# Patient Record
Sex: Female | Born: 2000 | Race: Black or African American | Hispanic: No | Marital: Single | Smoking: Current some day smoker
Health system: Southern US, Community
[De-identification: ages and names within clinical notes are randomized; demographics above are authoritative.]

## PROBLEM LIST (undated history)

## (undated) DIAGNOSIS — E559 Vitamin D deficiency, unspecified: Secondary | ICD-10-CM

## (undated) DIAGNOSIS — M1990 Unspecified osteoarthritis, unspecified site: Secondary | ICD-10-CM

---
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
## 2022-08-24 ENCOUNTER — Encounter (HOSPITAL_COMMUNITY): Payer: Self-pay

## 2022-08-24 ENCOUNTER — Other Ambulatory Visit: Payer: Self-pay

## 2022-08-24 ENCOUNTER — Emergency Department (HOSPITAL_COMMUNITY)
Admission: EM | Admit: 2022-08-24 | Discharge: 2022-08-24 | Disposition: A | Discharge status: Home / Self Care | Payer: Medicaid Other | Attending: Emergency Medicine | Admitting: Emergency Medicine

## 2022-08-24 DIAGNOSIS — R42 Dizziness and giddiness: Secondary | ICD-10-CM | POA: Diagnosis present

## 2022-08-24 HISTORY — DX: Vitamin D deficiency, unspecified: E55.9

## 2022-08-24 LAB — COMPREHENSIVE METABOLIC PANEL
ALT: 14 U/L (ref 0–44)
AST: 17 U/L (ref 15–41)
Albumin: 3.9 g/dL (ref 3.5–5.0)
Alkaline Phosphatase: 71 U/L (ref 38–126)
Anion gap: 9 (ref 5–15)
BUN: 9 mg/dL (ref 6–20)
CO2: 24 mmol/L (ref 22–32)
Calcium: 9.2 mg/dL (ref 8.9–10.3)
Chloride: 106 mmol/L (ref 98–111)
Creatinine, Ser: 0.4 mg/dL — ABNORMAL LOW (ref 0.44–1.00)
GFR, Estimated: >60 mL/min (ref >60)
Glucose, Bld: 108 mg/dL — ABNORMAL HIGH (ref 70–99)
Potassium: 3.7 mmol/L (ref 3.5–5.1)
Sodium: 139 mmol/L (ref 135–145)
Total Bilirubin: 0.4 mg/dL (ref 0.3–1.2)
Total Protein: 7.8 g/dL (ref 6.5–8.1)

## 2022-08-24 LAB — CBC WITH DIFFERENTIAL/PLATELET
Abs Immature Granulocytes: 0.01 10*3/uL (ref 0.00–0.07)
Basophils Absolute: 0 10*3/uL (ref 0.0–0.1)
Basophils Relative: 1 %
Eosinophils Absolute: 0 10*3/uL (ref 0.0–0.5)
Eosinophils Relative: 0 %
HCT: 37.2 % (ref 36.0–46.0)
Hemoglobin: 11.8 g/dL — ABNORMAL LOW (ref 12.0–15.0)
Immature Granulocytes: 0 %
Lymphocytes Relative: 45 %
Lymphs Abs: 1.7 10*3/uL (ref 0.7–4.0)
MCH: 27.6 pg (ref 26.0–34.0)
MCHC: 31.7 g/dL (ref 30.0–36.0)
MCV: 87.1 fL (ref 80.0–100.0)
Monocytes Absolute: 0.3 10*3/uL (ref 0.1–1.0)
Monocytes Relative: 8 %
Neutro Abs: 1.7 10*3/uL (ref 1.7–7.7)
Neutrophils Relative %: 46 %
Platelets: 296 10*3/uL (ref 150–400)
RBC: 4.27 MIL/uL (ref 3.87–5.11)
RDW: 13.9 % (ref 11.5–15.5)
WBC: 3.8 10*3/uL — ABNORMAL LOW (ref 4.0–10.5)
nRBC: 0 % (ref 0.0–0.2)

## 2022-08-24 MED ORDER — MECLIZINE HCL 25 MG PO TABS
25.0000 mg | ORAL_TABLET | Freq: Once | ORAL | Status: AC
  Administered 2022-08-24: 25 mg via ORAL
  Filled 2022-08-24: qty 1

## 2022-08-24 MED ORDER — MECLIZINE HCL 25 MG PO TABS: 25.0000 mg | ORAL_TABLET | Freq: Three times a day (TID) | ORAL | 0 refills | Status: AC | PRN

## 2022-08-24 NOTE — ED Provider Triage Note (Addendum)
Emergency Medicine Provider Triage Evaluation Note  April Palmer , a 21 y.o. female  was evaluated in triage.  Pt complains of dizziness.  Patient reports feelings of dizziness beginning yesterday upon awakening.  She states that she initially thought that symptoms were related to hallucination but they persisted.  She feels "room spinning type sensation" that is worsened with positional changes and changes of her head.  Denies history of similar symptoms.  States that she has had intermittent migraine over the past few days as well that feels similar in nature to migraines experienced in the past.  Denies visual disturbance, gait abnormalities (besides when feelings of dizziness), weakness/sensory deficits in upper lower extremities, slurred speech, drooping of the face.  Review of Systems  Positive: See above Negative:   Physical Exam  BP (!) 138/92 (BP Location: Left Arm)   Pulse 96   Temp 98.7 F (37.1 C) (Oral)   Resp 18   Ht 5\' 4"  (1.626 m)   Wt 88 kg   LMP 08/09/2022 (Approximate)   SpO2 99%   BMI 33.30 kg/m  Gen:   Awake, no distress   Resp:  Normal effort  MSK:   Moves extremities without difficulty Other:    Medical Decision Making  Medically screening exam initiated at 1:54 PM.  Appropriate orders placed.  Rise Traeger was informed that the remainder of the evaluation will be completed by another provider, this initial triage assessment does not replace that evaluation, and the importance of remaining in the ED until their evaluation is complete.  Urine pregnancy negative in urgent care today.    Imagene Sheller, Peter Garter 08/24/22 1355    14/08/23, Peter Garter 08/24/22 1356

## 2022-08-24 NOTE — Discharge Instructions (Addendum)
Note the workup today was overall reassuring.  As discussed, your symptoms of dizziness are most likely secondary to peripheral cause of vertigo.  Take meclizine as needed for similar symptoms.  Recommend follow-up with your primary care as well as neurology regarding your symptoms.  Please not hesitate to return to emergency department for worrisome signs and symptoms we discussed become apparent.

## 2022-08-24 NOTE — ED Triage Notes (Addendum)
Patient reports that she became dizzy yesterday. Patient states when I move it feels like I did it 10 times. "It is more like spinning." Patient states she can not dress herself due to dizziness.  Patient states she went to an UC today and was told to come to the ED for further evaluation. Patient states she was tested for flu, Covid and urine and all were negative.

## 2022-08-24 NOTE — ED Provider Notes (Signed)
[facility]-EMERGENCY DEPT Provider Note   CSN: [id number] Arrival date & time: 08/24/22  1318     History  Chief Complaint  Patient presents with   Dizziness    April Palmer is a 21 y.o. female.   Dizziness   21 year old female presents emergency department with complaints of dizziness. Patient reports feelings of dizziness beginning yesterday upon awakening. She states that she initially thought that symptoms were related to hallucination but they persisted. She feels "room spinning type sensation" that is worsened with positional changes and changes of her head. Denies history of similar symptoms. States that she has had intermittent migraine over the past few days as well that feels similar in nature to migraines experienced in the past. Denies visual disturbance, gait abnormalities (besides when feelings of dizziness), weakness/sensory deficits in upper lower extremities, slurred speech, drooping of the face.   No significant past medical history.  Home Medications Prior to Admission medications   Medication Sig Start Date End Date Taking? Authorizing Provider  meclizine (ANTIVERT) 25 MG tablet Take 1 tablet (25 mg total) by mouth 3 (three) times daily as needed for dizziness. 08/24/22  Yes Sherian Maroon A, PA  cetirizine (ZYRTEC) 10 MG tablet Take 1 tablet (10 mg total) by mouth daily. For 2 weeks then as needed for allergy symptoms 12/03/14   Ree Shay, MD  desmopressin (DDAVP) 0.1 MG tablet Take 0.5 tablets (0.05 mg total) by mouth as needed. 02/29/20   Dessa Phi, MD  metroNIDAZOLE (FLAGYL) 500 MG tablet Take 500 mg by mouth 2 (two) times daily. 02/29/20   [provider]  PROAIR HFA 108 (90 Base) MCG/ACT inhaler INHALE 2 PUFFS WITH SPACER EVERY 4-6 HOURS WHEN NECESSARY 07/11/18   [provider]  Vitamin D, Ergocalciferol, (DRISDOL) 1.25 MG (50000 UT) CAPS capsule TAKE 1 CAPSULE BY MOUTH ONE TIME PER WEEK 04/06/19   Dessa Phi,  MD      Allergies    Patient has no known allergies.    Review of Systems   Review of Systems  Neurological:  Positive for dizziness.  All other systems reviewed and are negative.   Physical Exam Updated Vital Signs BP (!) 138/92 (BP Location: Left Arm)   Pulse 96   Temp 98.7 F (37.1 C) (Oral)   Resp 18   Ht 5\' 4"  (1.626 m)   Wt 88 kg   LMP 08/09/2022 (Approximate)   SpO2 99%   BMI 33.30 kg/m  Physical Exam Vitals and nursing note reviewed.  Constitutional:      General: She is not in acute distress.    Appearance: She is well-developed.  HENT:     Head: Normocephalic and atraumatic.  Eyes:     Conjunctiva/sclera: Conjunctivae normal.  Cardiovascular:     Rate and Rhythm: Normal rate and regular rhythm.     Heart sounds: No murmur heard. Pulmonary:     Effort: Pulmonary effort is normal. No respiratory distress.     Breath sounds: Normal breath sounds.  Abdominal:     Palpations: Abdomen is soft.     Tenderness: There is no abdominal tenderness.  Musculoskeletal:        General: No swelling.     Cervical back: Neck supple.  Skin:    General: Skin is warm and dry.     Capillary Refill: Capillary refill takes less than 2 seconds.  Neurological:     Mental Status: She is alert.     Comments: Alert and oriented to  self, place, time and event.   Speech is fluent, clear without dysarthria or dysphasia.   Strength 5/5 in upper/lower extremities   Sensation intact in upper/lower extremities   Normal gait.  Negative Romberg. No pronator drift.  Normal finger-to-nose and feet tapping.  CN I not tested  CN II grossly intact visual fields bilaterally. Did not visualize posterior eye.  CN III, IV, VI PERRLA and EOMs intact bilaterally with rightward beating nystagmus. CN V Intact sensation to sharp and light touch to the face  CN VII facial movements symmetric  CN VIII not tested  CN IX, X no uvula deviation, symmetric rise of soft palate  CN XI 5/5 SCM and  trapezius strength bilaterally  CN XII Midline tongue protrusion, symmetric L/R movements    Head impulse test positive with rightward horizontal rotation of the head.  Psychiatric:        Mood and Affect: Mood normal.     ED Results / Procedures / Treatments   Labs (all labs ordered are listed, but only abnormal results are displayed) Labs Reviewed  COMPREHENSIVE METABOLIC PANEL - Abnormal; Notable for the following components:      Result Value   Glucose, Bld 108 (*)    Creatinine, Ser 0.40 (*)    All other components within normal limits  CBC WITH DIFFERENTIAL/PLATELET - Abnormal; Notable for the following components:   WBC 3.8 (*)    Hemoglobin 11.8 (*)    All other components within normal limits    EKG None  Radiology No results found.  Procedures Procedures    Medications Ordered in ED Medications  meclizine (ANTIVERT) tablet 25 mg (25 mg Oral Given 08/24/22 1440)    ED Course/ Medical Decision Making/ A&P                           Medical Decision Making Amount and/or Complexity of Data Reviewed Labs: ordered.   This patient presents to the ED for concern of dizziness, this involves an extensive number of treatment options, and is a complaint that carries with it a high risk of complications and morbidity.  The differential diagnosis includes BPPV, Mnire's disease, labyrinthitis, CVA, electrolyte abnormalities, pregnancy, medication side effect, drug/alcohol withdrawal/intoxication, disequilibrium   Co morbidities that complicate the patient evaluation  See HPI   Additional history obtained:  Additional history obtained from EMR External records from outside source obtained and reviewed including hospital records   Lab Tests:  I Ordered, and personally interpreted labs.  The pertinent results include: No electrolyte abnormalities noted.  No transaminitis.  Renal function within the limits.  Mild leukopenia with a WBC of 3.8.  No evidence  anemia.  Platelets normal range.  Urine pregnancy from urgent care negative earlier today   Imaging Studies ordered:  N/a   Cardiac Monitoring: / EKG:  The patient was maintained on a cardiac monitor.  I personally viewed and interpreted the cardiac monitored which showed an underlying rhythm of: Sinus rhythm   Consultations Obtained:  N/a   Problem List / ED Course / Critical interventions / Medication management  Dizziness I ordered medication including Antivert  Reevaluation of the patient after these medicines showed that the patient improved I have reviewed the patients home medicines and have made adjustments as needed   Social Determinants of Health:  Denies tobacco, licit drug use   Test / Admission - Considered:  Dizziness Vitals signs  within normal range and stable throughout visit.  Laboratory/imaging studies significant for: See above Patient's workup today overall reassuring.  Symptoms seem most likely peripheral in nature given physical exam findings and history of present illness.  Patient responded well to Antivert administered while in emergency department.  Continued therapy recommended outpatient.  Patient also discussed at length regarding Epley maneuver for potential BPPV.  Recommend follow-up with primary care as well as neurology if needed.  Treatment plan discussed with the patient and she milligrams and was agreeable to said plan. Worrisome signs and symptoms were discussed with the patient, and the patient acknowledged understanding to return to the ED if noticed. Patient was stable upon discharge.          Final Clinical Impression(s) / ED Diagnoses Final diagnoses:  Dizziness    Rx / DC Orders ED Discharge Orders          Ordered    meclizine (ANTIVERT) 25 MG tablet  3 times daily PRN        08/24/22 1522              Peter Garter, [place] 08/24/22 1531    Lorre Nick, MD 08/27/22 1411

## 2023-03-22 ENCOUNTER — Encounter (INDEPENDENT_AMBULATORY_CARE_PROVIDER_SITE_OTHER): Payer: Self-pay
